# Patient Record
Sex: Female | Born: 2000 | Race: White | Hispanic: No | Marital: Single | Smoking: Never smoker
Health system: Southern US, Community
[De-identification: ages and names within clinical notes are randomized; demographics above are authoritative.]

---
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
## 2020-02-21 ENCOUNTER — Ambulatory Visit: Payer: Self-pay

## 2020-02-22 ENCOUNTER — Encounter: Payer: Self-pay | Admitting: Emergency Medicine

## 2020-02-22 ENCOUNTER — Other Ambulatory Visit: Payer: Self-pay

## 2020-02-22 ENCOUNTER — Ambulatory Visit
Admission: EM | Admit: 2020-02-22 | Discharge: 2020-02-22 | Disposition: A | Discharge status: Home / Self Care | Payer: No Typology Code available for payment source | Attending: Family Medicine | Admitting: Family Medicine

## 2020-02-22 DIAGNOSIS — R350 Frequency of micturition: Secondary | ICD-10-CM | POA: Insufficient documentation

## 2020-02-22 DIAGNOSIS — R3915 Urgency of urination: Secondary | ICD-10-CM | POA: Insufficient documentation

## 2020-02-22 DIAGNOSIS — R300 Dysuria: Secondary | ICD-10-CM | POA: Insufficient documentation

## 2020-02-22 LAB — POCT URINALYSIS DIP (MANUAL ENTRY)
Bilirubin, UA: NEGATIVE
Glucose, UA: NEGATIVE mg/dL
Ketones, POC UA: NEGATIVE mg/dL
Leukocytes, UA: NEGATIVE
Nitrite, UA: NEGATIVE
Protein Ur, POC: NEGATIVE mg/dL
Spec Grav, UA: 1.02 (ref 1.010–1.025)
Urobilinogen, UA: 0.2 E.U./dL
pH, UA: 7 (ref 5.0–8.0)

## 2020-02-22 MED ORDER — NITROFURANTOIN MONOHYD MACRO 100 MG PO CAPS: 100.0000 mg | ORAL_CAPSULE | Freq: Two times a day (BID) | ORAL | 0 refills | Status: DC

## 2020-02-22 MED ORDER — PHENAZOPYRIDINE HCL 200 MG PO TABS: 200.0000 mg | ORAL_TABLET | Freq: Three times a day (TID) | ORAL | 0 refills | Status: DC

## 2020-02-22 NOTE — ED Triage Notes (Signed)
Patient states that she has frequency urination x 3-4 days. feels like UTI LMP- 2 weeks ago

## 2020-02-22 NOTE — Discharge Instructions (Addendum)
You may have a urinary tract infection.   I have sent in Macrobid for you to take twice a day for 5 days  We are going to culture your urine and will call you as soon as we have the results.   Drink plenty of water, 8-10 glasses per day.   You may take the pyridium three times a day as needed for urinary discomfort  Follow up with your primary care provider as needed.   Go to the Emergency Department if you experience severe pain, shortness of breath, high fever, or other concerns.

## 2020-02-22 NOTE — ED Provider Notes (Signed)
[facility]   CC: UTI  SUBJECTIVE:  Brittany Spencer is a 19 y.o. female who complains of urinary frequency, urgency and dysuria for the past 3-4 days. Patient denies a precipitating event, recent sexual encounter, excessive caffeine intake. Localizes the pain to the lower abdomen. Pain is intermittent and describes it as sharp. Has tried OTC medications without relief.  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 Symptoms are made worse with urination. Admits to similar symptoms in the past. Denies fever, chills, nausea, vomiting, flank pain, abnormal vaginal discharge or bleeding, hematuria.    LMP: Patient's last menstrual period was 02/08/2020.  ROS: As in HPI.  All other pertinent ROS negative.     History reviewed. No pertinent past medical history. History reviewed. No pertinent surgical history. No Known Allergies No current facility-administered medications on file prior to encounter.   No current outpatient medications on file prior to encounter.   Social History   Socioeconomic History  . Marital status: Single    Spouse name: Not on file  . Number of children: Not on file  . Years of education: Not on file  . Highest education level: Not on file  Occupational History  . Not on file  Tobacco Use  . Smoking status: Never Smoker  . Smokeless tobacco: Never Used  Substance and Sexual Activity  . Alcohol use: Never  . Drug use: Never  . Sexual activity: Not on file  Other Topics Concern  . Not on file  Social History Narrative  . Not on file   Social Determinants of Health   Financial Resource Strain:   . Difficulty of Paying Living Expenses: Not on file  Food Insecurity:   . Worried About Programme researcher, broadcasting/film/video in the Last Year: Not on file  . Ran Out of Food in the Last Year: Not on file  Transportation Needs:   . Lack of Transportation (Medical): Not on file  . Lack of  Transportation (Non-Medical): Not on file  Physical Activity:   . Days of Exercise per Week: Not on file  . Minutes of Exercise per Session: Not on file  Stress:   . Feeling of Stress : Not on file  Social Connections:   . Frequency of Communication with Friends and Family: Not on file  . Frequency of Social Gatherings with Friends and Family: Not on file  . Attends Religious Services: Not on file  . Active Member of Clubs or Organizations: Not on file  . Attends Banker Meetings: Not on file  . Marital Status: Not on file  Intimate Partner Violence:   . Fear of Current or Ex-Partner: Not on file  . Emotionally Abused: Not on file  . Physically Abused: Not on file  . Sexually Abused: Not on file   No family history on file.  OBJECTIVE:  Vitals:   02/22/20 1419 02/22/20 1427  BP: 122/71   Pulse: 70   Resp: 16   Temp: 98.3 F (36.8 C)   SpO2: 98%   Weight:  190 lb (86.2 kg)  Height:  5\' 5"  (1.651 m)   General appearance: AOx3 in no acute distress HEENT: NCAT. Oropharynx clear.  Lungs: clear to auscultation bilaterally without adventitious breath sounds Heart: regular rate and rhythm. Radial pulses 2+ symmetrical bilaterally Abdomen: soft; non-distended; suprapubic tenderness; bowel sounds present; no guarding or rebound tenderness Back: no CVA tenderness Extremities: no  edema; symmetrical with no gross deformities Skin: warm and dry Neurologic: Ambulates from chair to exam table without difficulty Psychological: alert and cooperative; normal mood and affect  Labs Reviewed  POCT URINALYSIS DIP (MANUAL ENTRY) - Abnormal; Notable for the following components:      Result Value   Blood, UA moderate (*)    All other components within normal limits  URINE CULTURE    ASSESSMENT & PLAN:  1. Dysuria   2. Urinary frequency   3. Urinary urgency     Meds ordered this encounter  Medications  . nitrofurantoin, macrocrystal-monohydrate, (MACROBID) 100 MG  capsule    Sig: Take 1 capsule (100 mg total) by mouth 2 (two) times daily.    Dispense:  10 capsule    Refill:  0    Order Specific Question:   Supervising Provider    Answer:   Merrilee Jansky [id number]  . phenazopyridine (PYRIDIUM) 200 MG tablet    Sig: Take 1 tablet (200 mg total) by mouth 3 (three) times daily.    Dispense:  6 tablet    Refill:  0    Order Specific Question:   Supervising Provider    Answer:   Merrilee Jansky [id number]    Urine culture sent  Prescribed Macrobid Prescribed Pyridium We will call you with abnormal results that need further treatment Push fluids and get plenty of rest Take antibiotic as directed and to completion Take pyridium as prescribed and as needed for symptomatic relief Follow up with PCP if symptoms persists Return here or go to ER if you have any new or worsening symptoms such as fever, worsening abdominal pain, nausea/vomiting, flank pain  Outlined signs and symptoms indicating need for more acute intervention Patient verbalized understanding After Visit Summary given     Moshe Cipro, NP 02/22/20 1441

## 2020-02-24 ENCOUNTER — Telehealth (HOSPITAL_COMMUNITY): Payer: Self-pay | Admitting: Emergency Medicine

## 2020-02-24 LAB — URINE CULTURE: Culture: >=100000 — AB

## 2020-02-24 MED ORDER — SULFAMETHOXAZOLE-TRIMETHOPRIM 800-160 MG PO TABS: 1.0000 | ORAL_TABLET | Freq: Two times a day (BID) | ORAL | 0 refills | Status: AC

## 2020-02-24 NOTE — Telephone Encounter (Signed)
Spoke to provider about patient's unresolved urinary symptoms and urine culture.  Per Brittany Spencer, APP will change to Bactrim, prescription sent to pharmacy.  Patient made aware.

## 2020-03-10 ENCOUNTER — Other Ambulatory Visit (HOSPITAL_COMMUNITY): Payer: Self-pay | Admitting: Family Medicine

## 2020-03-10 DIAGNOSIS — R0989 Other specified symptoms and signs involving the circulatory and respiratory systems: Secondary | ICD-10-CM

## 2020-03-20 ENCOUNTER — Ambulatory Visit
Admission: RE | Admit: 2020-03-20 | Discharge: 2020-03-20 | Disposition: A | Discharge status: Home / Self Care | Payer: No Typology Code available for payment source | Source: Ambulatory Visit | Attending: Family Medicine | Admitting: Family Medicine

## 2020-03-20 ENCOUNTER — Other Ambulatory Visit: Payer: Self-pay

## 2020-03-20 DIAGNOSIS — R0989 Other specified symptoms and signs involving the circulatory and respiratory systems: Secondary | ICD-10-CM | POA: Diagnosis not present

## 2020-11-13 ENCOUNTER — Ambulatory Visit
Admission: EM | Admit: 2020-11-13 | Discharge: 2020-11-13 | Disposition: A | Discharge status: Home / Self Care | Payer: No Typology Code available for payment source | Attending: Family Medicine | Admitting: Family Medicine

## 2020-11-13 ENCOUNTER — Other Ambulatory Visit: Payer: Self-pay

## 2020-11-13 DIAGNOSIS — N390 Urinary tract infection, site not specified: Secondary | ICD-10-CM | POA: Diagnosis present

## 2020-11-13 LAB — POCT URINALYSIS DIP (MANUAL ENTRY)
Bilirubin, UA: NEGATIVE
Glucose, UA: NEGATIVE mg/dL
Ketones, POC UA: NEGATIVE mg/dL
Nitrite, UA: POSITIVE — AB
Spec Grav, UA: 1.02 (ref 1.010–1.025)
Urobilinogen, UA: 1 E.U./dL
pH, UA: 6.5 (ref 5.0–8.0)

## 2020-11-13 MED ORDER — SULFAMETHOXAZOLE-TRIMETHOPRIM 800-160 MG PO TABS: 1.0000 | ORAL_TABLET | Freq: Two times a day (BID) | ORAL | 0 refills | Status: AC

## 2020-11-13 NOTE — ED Provider Notes (Signed)
[facility]    CSN: [id number] Arrival date & time: 11/13/20  0802      History   Chief Complaint Chief Complaint  Patient presents with   Dysuria    HPI Brittany Spencer is a 20 y.o. female.   HPI Brittany Spencer is a 20 y.o. female presents for evaluation of urinary frequency, urgency and dysuria x 7 days, without flank pain, fever, chills, or abnormal vaginal discharge or bleeding.  She has a history of recurrent urinary tract infections however has not been able to be seen by a urologist.  She has been chronically taking nitrofurantoin at the first sign of UTI symptoms however has been taken of last 7 days without any relief of symptoms.  Previous urine cytology from 02/21/2020 showed the presence of E. coli.  Patient's last menstrual period was 10/27/2020 (approximate).   History reviewed. No pertinent past medical history.  There are no problems to display for this patient.   History reviewed. No pertinent surgical history.  OB History   No obstetric history on file.      Home Medications    Prior to Admission medications   Medication Sig Start Date End Date Taking? Authorizing Provider  sulfamethoxazole-trimethoprim (BACTRIM DS) 800-160 MG tablet Take 1 tablet by mouth 2 (two) times daily. 11/13/20  Yes Bing Neighbors, FNP  nitrofurantoin, macrocrystal-monohydrate, (MACROBID) 100 MG capsule Take 1 capsule (100 mg total) by mouth 2 (two) times daily. 02/22/20   Moshe Cipro, NP  phenazopyridine (PYRIDIUM) 200 MG tablet Take 1 tablet (200 mg total) by mouth 3 (three) times daily. 02/22/20   Moshe Cipro, NP    Family History History reviewed. No pertinent family history.  Social History Social History   Tobacco Use   Smoking status: Never   Smokeless tobacco: Never  Substance Use Topics   Alcohol use: Never   Drug use: Never     Allergies   Patient has no known allergies.   Review of Systems Review of  Systems Pertinent negatives listed in HPI   Physical Exam Triage Vital Signs ED Triage Vitals [11/13/20 0811]  Enc Vitals Group     BP 135/84     Pulse Rate 75     Resp 20     Temp 98.5 F (36.9 C)     Temp src      SpO2 95 %     Weight      Height      Head Circumference      Peak Flow      Pain Score 3     Pain Loc      Pain Edu?      Excl. in GC?    No data found.  Updated Vital Signs BP 135/84   Pulse 75   Temp 98.5 F (36.9 C)   Resp 20   LMP 10/27/2020 (Approximate)   SpO2 95%   Visual Acuity Right Eye Distance:   Left Eye Distance:   Bilateral Distance:    Right Eye Near:   Left Eye Near:    Bilateral Near:     Physical Exam General appearance: alert, well developed, well nourished, cooperative  Head: Normocephalic, without obvious abnormality, atraumatic Respiratory: Respirations even and unlabored, normal respiratory rate Heart: Rate and rhythm normal. No gallop or murmurs noted on exam  Abdomen: negative for flank pain  Extremities: No gross deformities Skin: Skin color, texture, turgor normal. No rashes seen  Treatments / Results  Labs (all  labs ordered are listed, but only abnormal results are displayed) Labs Reviewed  POCT URINALYSIS DIP (MANUAL ENTRY) - Abnormal; Notable for the following components:      Result Value   Blood, UA moderate (*)    Protein Ur, POC trace (*)    Nitrite, UA Positive (*)    Leukocytes, UA Moderate (2+) (*)    All other components within normal limits  URINE CULTURE    EKG   Radiology No results found.  Procedures Procedures (including critical care time)  Medications Ordered in UC Medications - No data to display  Initial Impression / Assessment and Plan / UC Course  I have reviewed the triage vital signs and the nursing notes.  Pertinent labs & imaging results that were available during my care of the patient were reviewed by me and considered in my medical decision making (see chart for  details).   Recurrent UTIs treated today with Bactrim.  Referral to [facility] urology placed today.  Patient needs to be seen emergently as she is currently not responding to antibiotics that are susceptible to E. coli.  Patient needs further work-up and evaluation of the source of her recurrent UTIs. Final Clinical Impressions(s) / UC Diagnoses   Final diagnoses:  Recurrent UTI (urinary tract infection)   Discharge Instructions   None    ED Prescriptions     Medication Sig Dispense Auth. Provider   sulfamethoxazole-trimethoprim (BACTRIM DS) 800-160 MG tablet Take 1 tablet by mouth 2 (two) times daily. 20 tablet Bing Neighbors, FNP      PDMP not reviewed this encounter.   Bing Neighbors, FNP 11/13/20 [phone]

## 2020-11-13 NOTE — ED Triage Notes (Signed)
Pt presents with c/o dysuria. Pt has frequent UTI and has been given macrobid from PCP but is still having symptoms

## 2020-11-15 ENCOUNTER — Other Ambulatory Visit: Payer: Self-pay

## 2020-11-15 ENCOUNTER — Ambulatory Visit (INDEPENDENT_AMBULATORY_CARE_PROVIDER_SITE_OTHER): Payer: No Typology Code available for payment source | Admitting: Urology

## 2020-11-15 ENCOUNTER — Encounter: Payer: Self-pay | Admitting: Urology

## 2020-11-15 VITALS — BP 136/84 | HR 78 | Ht 65.0 in | Wt 193.0 lb

## 2020-11-15 DIAGNOSIS — N390 Urinary tract infection, site not specified: Secondary | ICD-10-CM | POA: Diagnosis not present

## 2020-11-15 LAB — URINE CULTURE: Culture: 20000 — AB

## 2020-11-15 LAB — BLADDER SCAN AMB NON-IMAGING: Scan Result: 31

## 2020-11-15 NOTE — Patient Instructions (Signed)
Take cranberry tablets twice daily. Continue daily probiotics. Start D mannose supplements.  Call us if you have any UTI symptoms for a visit.

## 2020-11-15 NOTE — Progress Notes (Signed)
11/15/2020 10:23 AM   Brittany Spencer 12-01-2000 [id number]  Referring provider: Bing Neighbors, FNP [address] [place],  [place] [address]  Chief Complaint  Patient presents with   Recurrent UTI    HPI: 20 year old female who presents today for further evaluation of recurrent UTIs.  She was seen and evaluated several times this year for an infection.    Once was on 02/2020 in the emergency room with dysuria.  On this occasion, she grew pansensitive E. coli.  She was prescribed Macrobid for 7 days and her symptoms resolved.  In the interim, she reports that she was seen and treated for 5 times by her primary care, Terie Purser, PA-C.  We do not have access to these notes.  She recalls that she dropped off at least 2 urine culture samples but on the other several occasions, antibiotics were just called in.  Every time, was Macrobid for 7 days.  She reports that after she finished the antibiotics, her symptoms will quickly recur or never fully resolved.  She was seen and evaluated again on 11/13/2020 with 7 days of dysuria found to have a frankly positive urinalysis.  She was prescribed Bactrim.  This urine culture was negative.  Per the FNP in the emergency room, she was felt to need "emergent" urology follow-up given her failure to respond.  She is sexually active with the same partner.  She is careful about voiding after intercourse.  She does not engage in anal intercourse.  She denies any vaginal symptoms.  No STI exposures, uses condoms.  Symptoms seem to be unrelated to sexual intercourse.  Prior to use September, she had no issues with recurrent infections.  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  Her family will continue to reside here in [place].    PMH: No past medical history on file.  Surgical History: No past surgical history on file.  Home Medications:  Allergies  as of 11/15/2020   No Known Allergies      Medication List        Accurate as of November 15, 2020 11:59 PM. If you have any questions, ask your nurse or doctor.          STOP taking these medications    nitrofurantoin (macrocrystal-monohydrate) 100 MG capsule Commonly known as: MACROBID Stopped by: Vanna Scotland, MD   phenazopyridine 200 MG tablet Commonly known as: PYRIDIUM Stopped by: Vanna Scotland, MD       TAKE these medications    sulfamethoxazole-trimethoprim 800-160 MG tablet Commonly known as: BACTRIM DS Take 1 tablet by mouth 2 (two) times daily.        Allergies: No Known Allergies  Family History: No family history on file.  Social History:  reports that she has never smoked. She has never used smokeless tobacco. She reports that she does not drink alcohol and does not use drugs.   Physical Exam: BP 136/84   Pulse 78   Ht 5\' 5"  (1.651 m)   Wt 193 lb (87.5 kg)   LMP 10/27/2020 (Approximate)   BMI 32.12 kg/m   Constitutional:  Alert and oriented, No acute distress. HEENT: [place] AT, moist mucus membranes.  Trachea midline, no masses. Cardiovascular: No clubbing, cyanosis, or edema. Respiratory: Normal respiratory effort, no increased work of breathing. Skin: No rashes, bruises or suspicious lesions. Neurologic: Grossly intact, no focal deficits, moving all 4  extremities. Psychiatric: Normal mood and affect.  Urinalysis Results for orders placed or performed in visit on 11/15/20  Microscopic Examination   Urine  Result Value Ref Range   WBC, UA 0-5 0 - 5 /hpf   RBC 0-2 0 - 2 /hpf   Epithelial Cells (non renal) 0-10 0 - 10 /hpf   Bacteria, UA None seen None seen/Few  Urinalysis, Complete  Result Value Ref Range   Specific Gravity, UA 1.020 1.005 - 1.030   pH, UA 7.0 5.0 - 7.5   Color, UA Yellow Yellow   Appearance Ur Clear Clear   Leukocytes,UA Negative Negative   Protein,UA Negative Negative/Trace   Glucose, UA Negative Negative    Ketones, UA Negative Negative   RBC, UA Trace (A) Negative   Bilirubin, UA Negative Negative   Urobilinogen, Ur 0.2 0.2 - 1.0 mg/dL   Nitrite, UA Negative Negative   Microscopic Examination See below:   Bladder Scan (Post Void Residual) in office  Result Value Ref Range   Scan Result 31     Assessment & Plan:    1. Recurrent UTI Records release signed for PCP for UA/urine culture data over the past 6 months  We discussed the pathophysiology of recurrent urinary tract infections and outside of sexual intercourse, she has essentially none.  She is emptying adequately today and her urine is negative.  It somewhat unclear whether or not she is being treated with the correct antibiotics, Macrobid each time and with the sensitivity pattern was for her most recent infections.  She did improve this past time with Bactrim rather than Macrobid.  We did discuss hygiene related behavior changes for which she is essentially already engaging in.  In addition to this, I recommended cranberry tablets twice daily, d-mannose, as well as daily probiotics as part of her normal regimen.  Avoid constipation.  At this point in time, I do not feel that upper tract imaging, cystoscopy, or suppressive antibiotics are warranted but if she continues to have frequent infections, may consider the aforementioned.  Of also recommended that she present for same or next day visit with one of our PAs when she has UTI symptoms that he can begin collecting data to ensure that were making the correct diagnosis and there is no additional underlying conditions.  She is agreeable this plan. - Urinalysis, Complete - Bladder Scan (Post Void Residual) in office   Vanna Scotland, MD  [facility] [address], [address] [place], [place] [address] [phone]

## 2020-11-17 LAB — URINALYSIS, COMPLETE
Bilirubin, UA: NEGATIVE
Glucose, UA: NEGATIVE
Ketones, UA: NEGATIVE
Leukocytes,UA: NEGATIVE
Nitrite, UA: NEGATIVE
Protein,UA: NEGATIVE
Specific Gravity, UA: 1.02 (ref 1.005–1.030)
Urobilinogen, Ur: 0.2 mg/dL (ref 0.2–1.0)
pH, UA: 7 (ref 5.0–7.5)

## 2020-11-17 LAB — MICROSCOPIC EXAMINATION: Bacteria, UA: NONE SEEN

## 2021-01-01 ENCOUNTER — Ambulatory Visit: Payer: No Typology Code available for payment source | Admitting: Urology

## 2021-10-14 IMAGING — US US CAROTID DUPLEX BILAT
1 series · 13 of 24 positions shown · non-contrast
Comparison: None.

CLINICAL DATA: 19-year-old female with a history left carotid bruit

EXAM:
BILATERAL CAROTID DUPLEX ULTRASOUND
TECHNIQUE: Gray scale imaging, color Doppler and duplex ultrasound were
performed of bilateral carotid and vertebral arteries in the neck.

[Series 1: us carotid duplex bilat · 0.06mm/px · 13 of 66 slices shown]
[im 1/66]
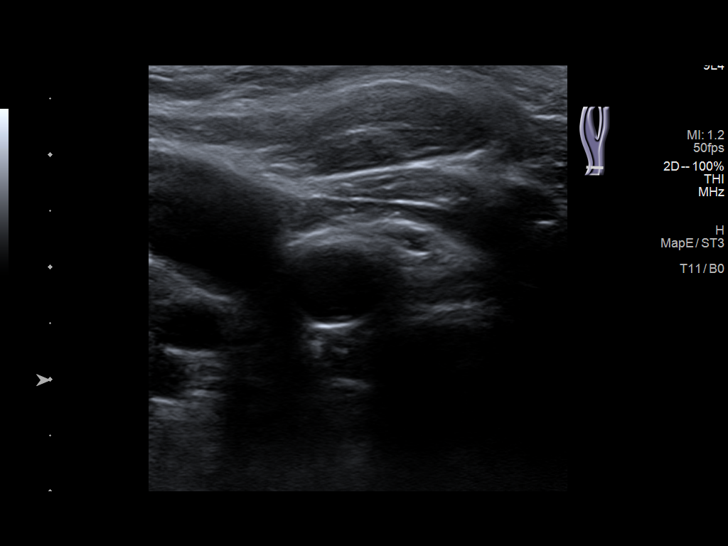
[im 6/66]
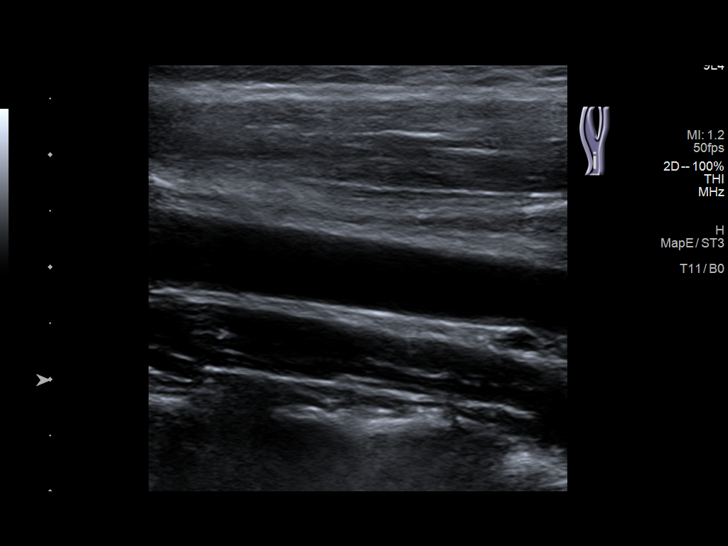
[im 12/66]
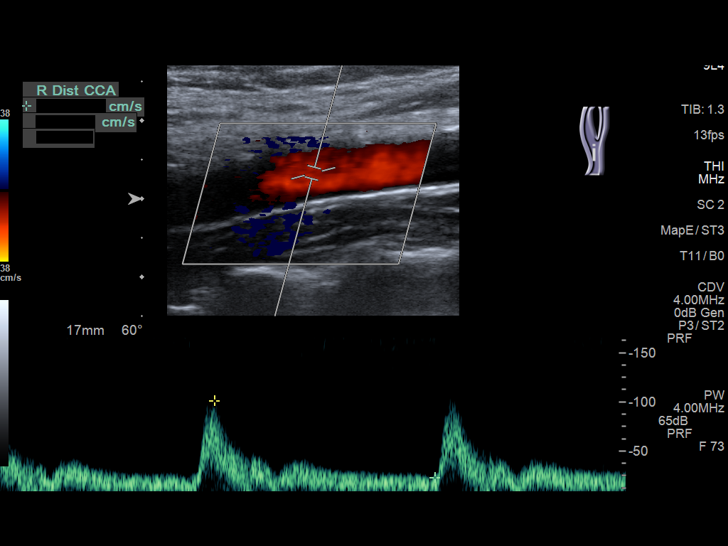
[im 17/66]
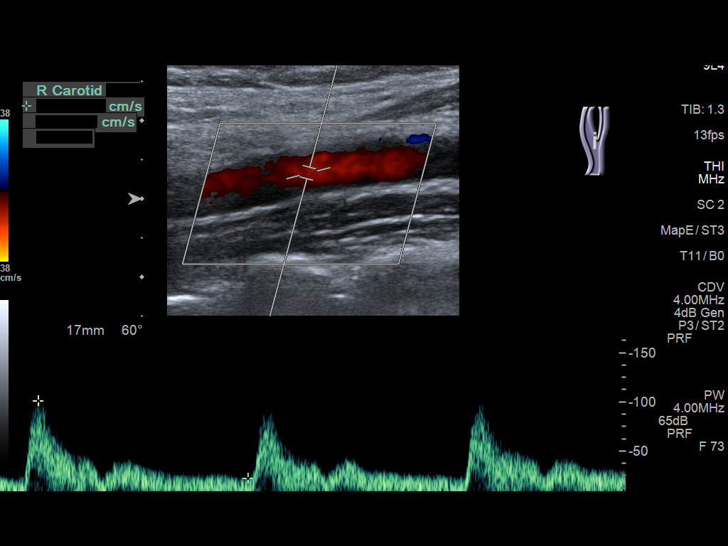
[im 23/66]
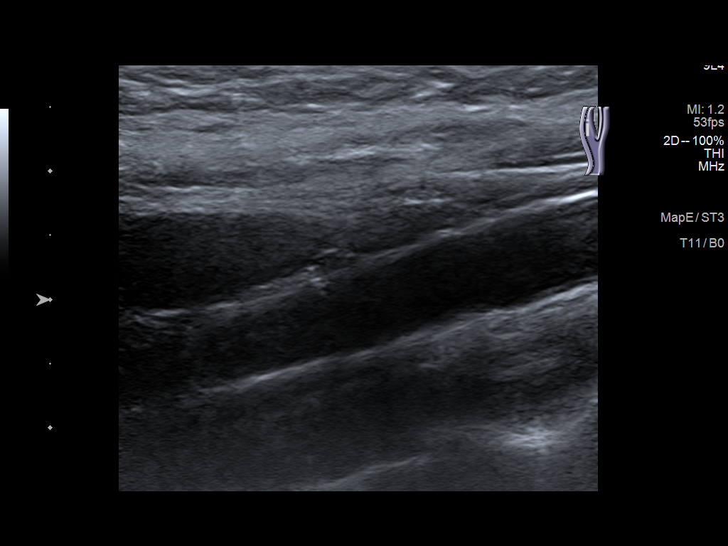
[im 29/66]
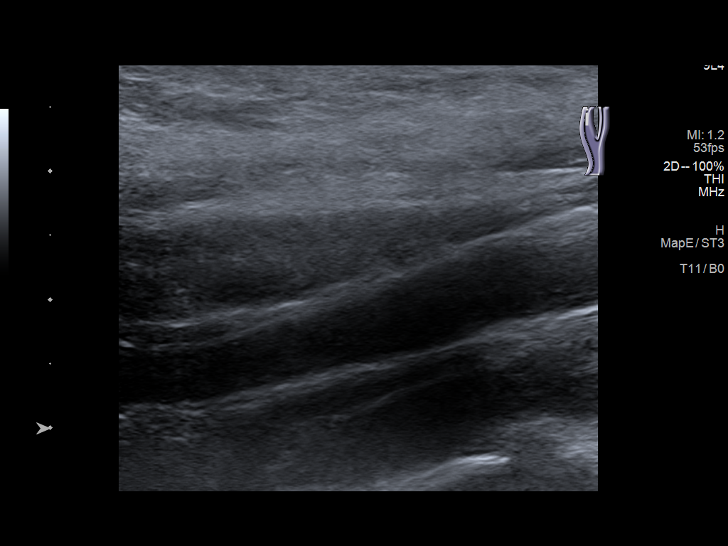
[im 34/66]
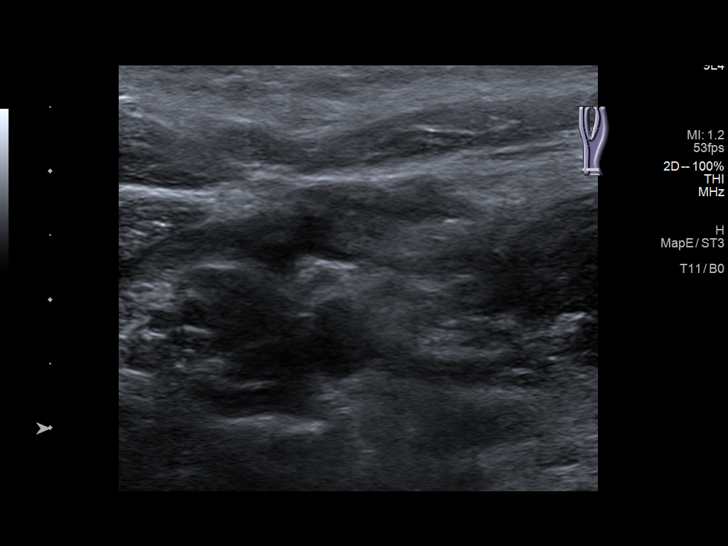
[im 37/66]
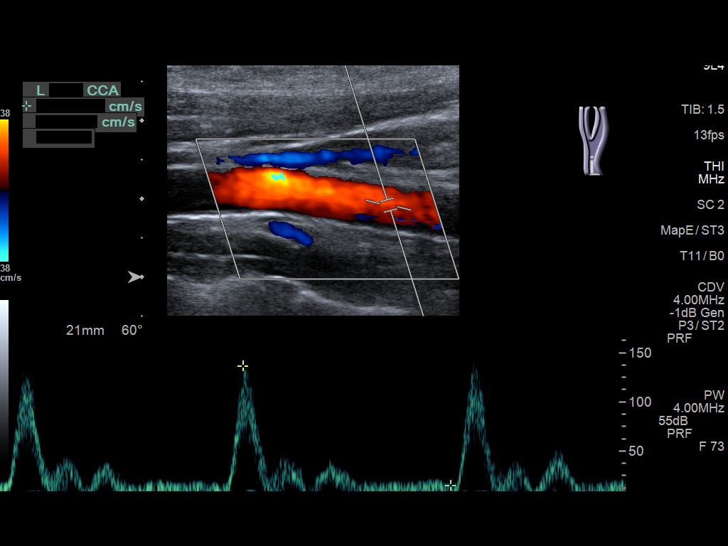
[im 43/66]
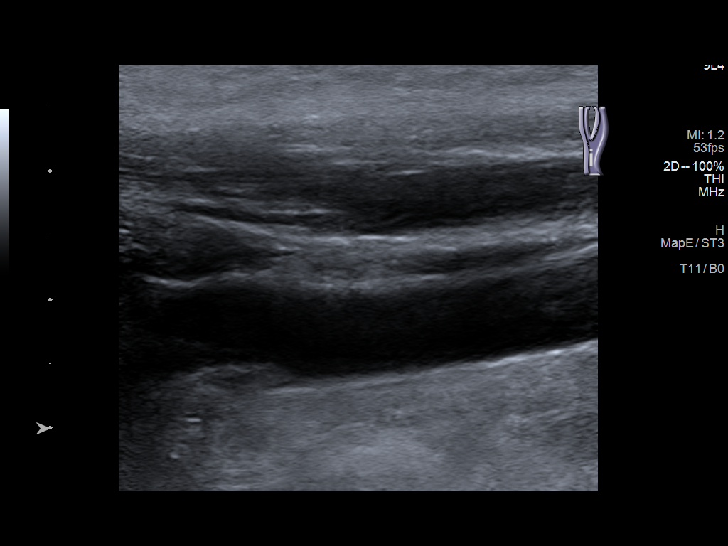
[im 49/66]
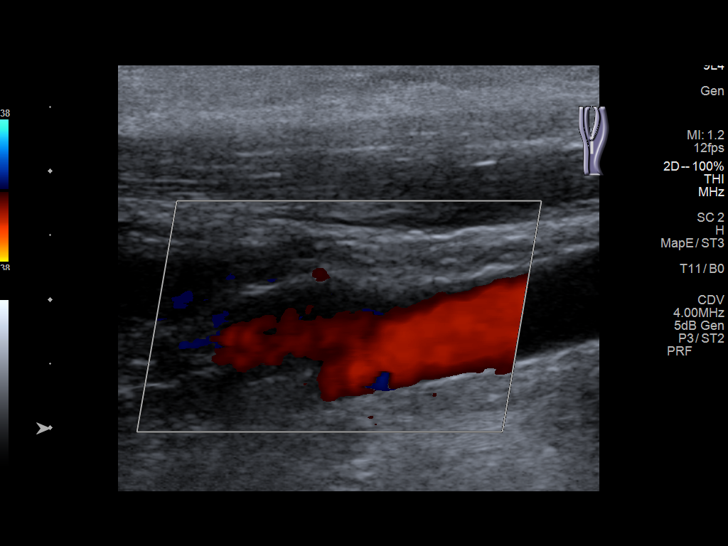
[im 54/66]
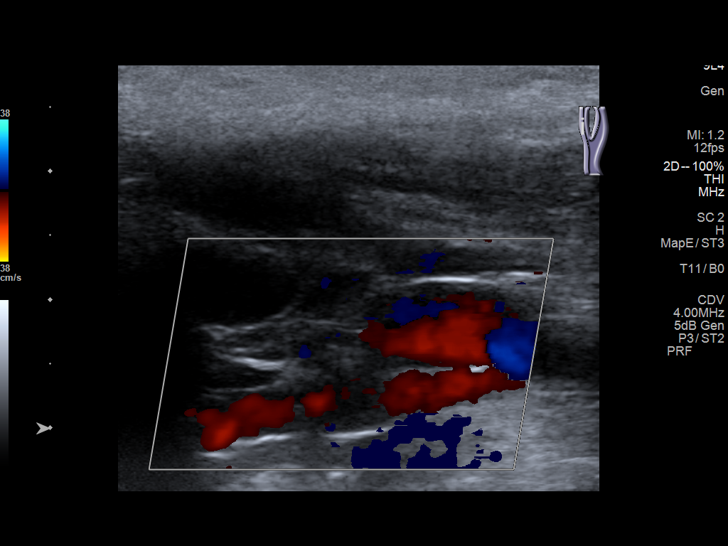
[im 60/66]
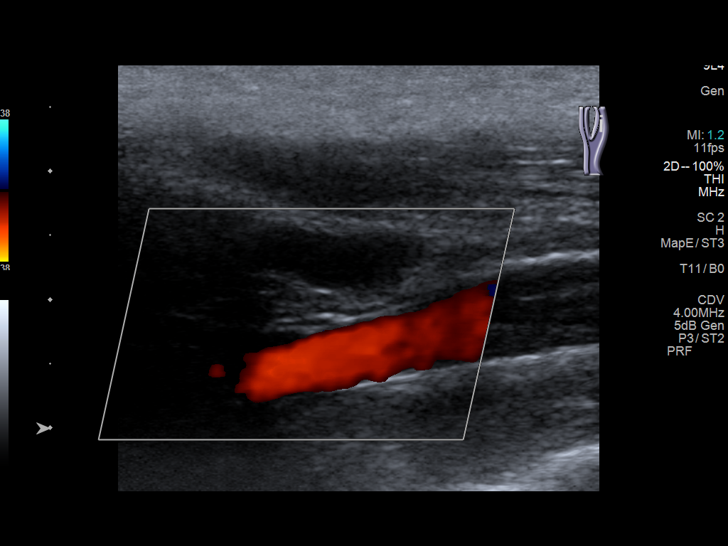
[im 66/66]
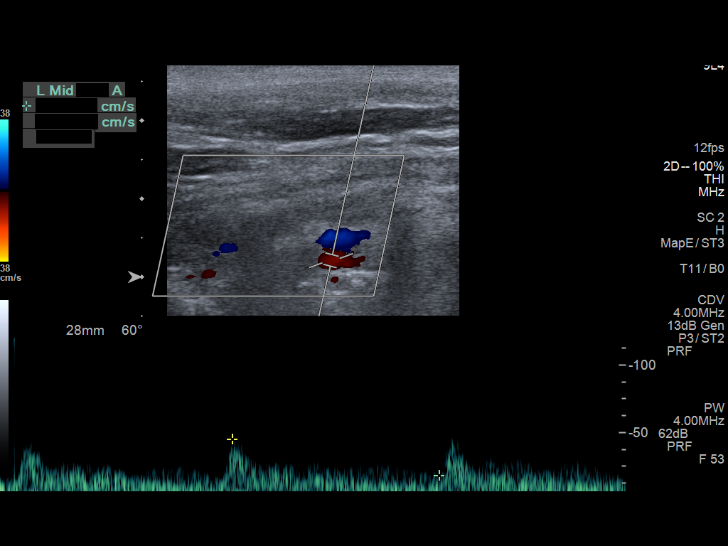

[13 of 24 positions shown; findings below may reference images not displayed]

FINDINGS: Criteria: Quantification of carotid stenosis is based on velocity
parameters that correlate the residual internal carotid diameter
with NASCET-based stenosis levels, using the diameter of the distal
internal carotid lumen as the denominator for stenosis measurement.

The following velocity measurements were obtained:

RIGHT

ICA:  Systolic 109 cm/sec, Diastolic 28 cm/sec

CCA:  147 cm/sec

SYSTOLIC ICA/CCA RATIO:

ECA:  132 cm/sec

LEFT

ICA:  Systolic 80 cm/sec, Diastolic 26 cm/sec

CCA:  137 cm/sec

SYSTOLIC ICA/CCA RATIO:

ECA:  81 cm/sec

Right Brachial SBP: Not acquired

Left Brachial SBP: Not acquired

RIGHT CAROTID ARTERY: Unremarkable appearance of the carotid system
without significant atherosclerotic disease. Intermediate waveform
of the CCA. Low resistance waveform of the ICA. No significant
tortuosity of the carotid system.

RIGHT VERTEBRAL ARTERY: Antegrade flow with low resistance waveform.

LEFT CAROTID ARTERY: Unremarkable appearance of the carotid system
without significant atherosclerotic disease. Intermediate waveform
of the CCA. Low resistance waveform of the ICA. No significant
tortuosity of the carotid system.

LEFT VERTEBRAL ARTERY:  Antegrade flow with low resistance waveform.
IMPRESSION: Color duplex indicates no significant plaque, with no
hemodynamically significant stenosis by duplex criteria in the
extracranial cerebrovascular circulation.
